# Patient Record
Sex: Male | Born: 1990 | Race: Black or African American | Hispanic: No | Smoking: Current every day smoker
Health system: Southern US, Community
[De-identification: ages and names within clinical notes are randomized; demographics above are authoritative.]

## PROBLEM LIST (undated history)

## (undated) DIAGNOSIS — E7800 Pure hypercholesterolemia, unspecified: Secondary | ICD-10-CM

---

## 2016-10-18 ENCOUNTER — Ambulatory Visit (HOSPITAL_COMMUNITY)
Admission: EM | Admit: 2016-10-18 | Discharge: 2016-10-18 | Disposition: A | Discharge status: Home / Self Care | Payer: PRIVATE HEALTH INSURANCE | Attending: Family Medicine | Admitting: Family Medicine

## 2016-10-18 ENCOUNTER — Ambulatory Visit (INDEPENDENT_AMBULATORY_CARE_PROVIDER_SITE_OTHER): Payer: PRIVATE HEALTH INSURANCE

## 2016-10-18 ENCOUNTER — Encounter (HOSPITAL_COMMUNITY): Payer: Self-pay | Admitting: Emergency Medicine

## 2016-10-18 DIAGNOSIS — M25562 Pain in left knee: Secondary | ICD-10-CM

## 2016-10-18 DIAGNOSIS — S838X2A Sprain of other specified parts of left knee, initial encounter: Secondary | ICD-10-CM | POA: Diagnosis not present

## 2016-10-18 MED ORDER — TRAMADOL HCL 50 MG PO TABS: ORAL_TABLET | ORAL | 0 refills | Status: DC

## 2016-10-18 MED ORDER — NAPROXEN 500 MG PO TABS: 500.0000 mg | ORAL_TABLET | Freq: Two times a day (BID) | ORAL | 0 refills | Status: DC

## 2016-10-18 NOTE — ED Triage Notes (Signed)
Here for left knee pain onset today  Reports he was walking and stepped off a curb and twisted ankle and fell down to the ground inj left knee/cap  Pain increases w/activity  Had aspirin today for pain  Brought back on wheelchair... A&O x4... NAD

## 2016-10-18 NOTE — Discharge Instructions (Signed)
Hopefully this is a sprain following your injury. However keep supported while ambulating. Elevate, ice and take NSAID as directed for the next 4-5 days. Use tramadol as needed for more severe pain for mostly at night. Call Dr. Eliberto IvoryBlackman's office on Monday for further evaluation. Feel better.

## 2016-10-18 NOTE — ED Provider Notes (Signed)
CSN: [id number]     Arrival date & time 10/18/16  1811 History   First MD Initiated Contact with Patient 10/18/16 1832     Chief Complaint  Patient presents with  . Knee Pain   (Consider location/radiation/quality/duration/timing/severity/associated sxs/prior Treatment) Harriett Sineerrance is a 25 yo who presents with left knee pain. He stepped off a curb today and twisted the left knee, thinking that it "I dislocated my patella". He reports that this happened in younger years and at that time had to have a knee aspiration. He reports this feels the same way. After the fall he was able to bear weight and ambulate, but when he got home the knee started to swell and has become very painful. He has pain with flexion and full extension.        History reviewed. No pertinent past medical history. History reviewed. No pertinent surgical history. History reviewed. No pertinent family history. Social History  Substance Use Topics  . Smoking status: Current Every Day Smoker    Types: E-cigarettes  . Smokeless tobacco: Never Used  . Alcohol use Yes    Review of Systems  All other systems reviewed and are negative.   Allergies  Shrimp [shellfish allergy]  Home Medications   Prior to Admission medications   Medication Sig Start Date End Date Taking? Authorizing Provider  naproxen (NAPROSYN) 500 MG tablet Take 1 tablet (500 mg total) by mouth 2 (two) times daily with a meal. 10/18/16   Riki SheerMichelle G Jaelee Laughter, PA-C  traMADol (ULTRAM) 50 MG tablet 1 every 6 hours as needed for severe pain 10/18/16   Riki SheerMichelle G Janella Rogala, PA-C   Meds Ordered and Administered this Visit  Medications - No data to display  BP 120/75 (BP Location: Left Arm)   Temp 99.3 F (37.4 C) (Oral)   Resp 20   SpO2 98%  No data found.   Physical Exam  Constitutional: He is oriented to person, place, and time. He appears well-developed and well-nourished. No distress.  Musculoskeletal:  Left knee with warmth and small effusion. Pain  along the lateral patella, but no obvious deformities. Pain with attempt at extension, currently examined in flexion  Neurological: He is alert and oriented to person, place, and time.  Skin: Skin is warm and dry. He is not diaphoretic.  Nursing note and vitals reviewed.   Urgent Care Course   Clinical Course     Procedures (including critical care time)  Labs Review Labs Reviewed - No data to display  Imaging Review Dg Knee Complete 4 Views Left  Result Date: 10/18/2016 CLINICAL DATA:  Stepped off curb wrong, pain in the knee EXAM: LEFT KNEE - COMPLETE 4+ VIEW COMPARISON:  None. FINDINGS: No fracture or dislocation is evident. Small to moderate suprapatellar joint effusion. Joint space compartments appear maintained. IMPRESSION: No acute fracture.  Small to moderate suprapatellar joint effusion Electronically Signed   By: Jasmine PangKim  Fujinaga M.D.   On: 10/18/2016 19:31     Visual Acuity Review  Right Eye Distance:   Left Eye Distance:   Bilateral Distance:    Right Eye Near:   Left Eye Near:    Bilateral Near:         MDM   1. Acute pain of left knee   2. Sprain of other ligament of left knee, initial encounter    Xrays reviewed. Patient with moderate pain and small joint effusion. Place in immobilizer, crutches with weight bear as tolerated. Ice, elevate and f/u with Orthopedics. Treat acutely  with NSAIDs and as needed Tramadol for more severe pain.     Riki SheerMichelle G Taber Sweetser, PA-C 10/18/16 [phone]

## 2016-11-04 ENCOUNTER — Ambulatory Visit (INDEPENDENT_AMBULATORY_CARE_PROVIDER_SITE_OTHER): Payer: Self-pay | Admitting: Orthopaedic Surgery

## 2016-12-27 ENCOUNTER — Ambulatory Visit (HOSPITAL_COMMUNITY)
Admission: EM | Admit: 2016-12-27 | Discharge: 2016-12-27 | Disposition: A | Discharge status: Home / Self Care | Payer: PRIVATE HEALTH INSURANCE | Attending: Family Medicine | Admitting: Family Medicine

## 2016-12-27 ENCOUNTER — Encounter (HOSPITAL_COMMUNITY): Payer: Self-pay | Admitting: Emergency Medicine

## 2016-12-27 DIAGNOSIS — G44209 Tension-type headache, unspecified, not intractable: Secondary | ICD-10-CM

## 2016-12-27 DIAGNOSIS — G43809 Other migraine, not intractable, without status migrainosus: Secondary | ICD-10-CM

## 2016-12-27 DIAGNOSIS — R42 Dizziness and giddiness: Secondary | ICD-10-CM | POA: Diagnosis not present

## 2016-12-27 MED ORDER — MECLIZINE HCL 25 MG PO TABS: 25.0000 mg | ORAL_TABLET | Freq: Three times a day (TID) | ORAL | 0 refills | Status: DC | PRN

## 2016-12-27 MED ORDER — CYCLOBENZAPRINE HCL 10 MG PO TABS: 10.0000 mg | ORAL_TABLET | Freq: Every day | ORAL | 0 refills | Status: DC

## 2016-12-27 MED ORDER — METHYLPREDNISOLONE 4 MG PO TBPK: ORAL_TABLET | ORAL | 0 refills | Status: DC

## 2016-12-27 NOTE — ED Triage Notes (Signed)
PT reports intermittent headaches and lightheadedness that started a  Month ago. PT has increased stress at work. PT believes stress makes episodes worse and more frequent.

## 2016-12-27 NOTE — ED Provider Notes (Signed)
CSN: [id number]     Arrival date & time 12/27/16  1513 History   First MD Initiated Contact with Patient 12/27/16 1558     Chief Complaint  Patient presents with  . Headache  . Dizziness   (Consider location/radiation/quality/duration/timing/severity/associated sxs/prior Treatment) Patient c/o pulsatile headache that starts with pressure in frontal area and then develops nausea and migraine features.  C/o phonophotophobia sx's.  Patient c/o dizziness.   The history is provided by the patient.  Headache  Pain location:  Generalized Quality:  Dull Severity currently:  5/10 Severity at highest:  8/10 Onset quality:  Sudden Duration:  1 day Timing:  Constant Progression:  Waxing and waning Chronicity:  New Similar to prior headaches: yes   Context: bright light and loud noise   Associated symptoms: dizziness   Dizziness  Associated symptoms: headaches     History reviewed. No pertinent past medical history. History reviewed. No pertinent surgical history. No family history on file. Social History  Substance Use Topics  . Smoking status: Current Every Day Smoker    Types: E-cigarettes  . Smokeless tobacco: Never Used  . Alcohol use Yes     Comment: 1-2 per week    Review of Systems  Constitutional: Negative.   HENT: Negative.   Eyes: Negative.   Respiratory: Negative.   Cardiovascular: Negative.   Gastrointestinal: Negative.   Endocrine: Negative.   Genitourinary: Negative.   Musculoskeletal: Negative.   Neurological: Positive for dizziness and headaches.  Hematological: Negative.   Psychiatric/Behavioral: Negative.     Allergies  Shrimp [shellfish allergy]  Home Medications   Prior to Admission medications   Medication Sig Start Date End Date Taking? Authorizing Provider  cyclobenzaprine (FLEXERIL) 10 MG tablet Take 1 tablet (10 mg total) by mouth at bedtime. 12/27/16   Deatra CanterWilliam J Jailen Lung, FNP  meclizine (ANTIVERT) 25 MG tablet Take 1 tablet (25 mg total) by  mouth 3 (three) times daily as needed for dizziness. 12/27/16   Deatra CanterWilliam J Ndia Sampath, FNP  methylPREDNISolone (MEDROL DOSEPAK) 4 MG TBPK tablet Take 6-5-4-3-2-1 po qd 12/27/16   Deatra CanterWilliam J Jameia Makris, FNP  naproxen (NAPROSYN) 500 MG tablet Take 1 tablet (500 mg total) by mouth 2 (two) times daily with a meal. 10/18/16   Riki SheerMichelle G Young, PA-C  traMADol (ULTRAM) 50 MG tablet 1 every 6 hours as needed for severe pain 10/18/16   Riki SheerMichelle G Young, PA-C   Meds Ordered and Administered this Visit  Medications - No data to display  BP 157/99   Pulse 89   Temp 98.5 F (36.9 C) (Oral)   Resp 16   Ht 5\' 7"  (1.702 m)   Wt 160 lb (72.6 kg)   SpO2 100%   BMI 25.06 kg/m  No data found.   Physical Exam  Constitutional: He is oriented to person, place, and time. He appears well-developed and well-nourished.  HENT:  Head: Normocephalic and atraumatic.  Right Ear: External ear normal.  Left Ear: External ear normal.  Mouth/Throat: Oropharynx is clear and moist.  Eyes: Conjunctivae and EOM are normal. Pupils are equal, round, and reactive to light.  Neck: Normal range of motion. Neck supple.  Cardiovascular: Normal rate, regular rhythm and normal heart sounds.   Pulmonary/Chest: Effort normal and breath sounds normal.  Neurological: He is alert and oriented to person, place, and time.  Nursing note and vitals reviewed.   Urgent Care Course     Procedures (including critical care time)  Labs Review Labs Reviewed - No data to  display  Imaging Review No results found.   Visual Acuity Review  Right Eye Distance:   Left Eye Distance:   Bilateral Distance:    Right Eye Near:   Left Eye Near:    Bilateral Near:         MDM   1. Vertigo   2. Tension-type headache, not intractable, unspecified chronicity pattern   3. Other migraine without status migrainosus, not intractable    Antivert 25mg  one po tid prn #21 Medrol dose pack as directed 4mg  #21 Cyclobenzaprine 10mg  po qhs #20       Deatra Canter, FNP 12/27/16 1708

## 2017-05-25 ENCOUNTER — Encounter (HOSPITAL_COMMUNITY): Payer: Self-pay | Admitting: Emergency Medicine

## 2017-05-25 ENCOUNTER — Ambulatory Visit (HOSPITAL_COMMUNITY)
Admission: EM | Admit: 2017-05-25 | Discharge: 2017-05-25 | Disposition: A | Discharge status: Home / Self Care | Payer: PRIVATE HEALTH INSURANCE | Attending: Physician Assistant | Admitting: Physician Assistant

## 2017-05-25 DIAGNOSIS — J029 Acute pharyngitis, unspecified: Secondary | ICD-10-CM | POA: Diagnosis not present

## 2017-05-25 MED ORDER — AZITHROMYCIN 250 MG PO TABS: ORAL_TABLET | ORAL | 0 refills | Status: AC

## 2017-05-25 NOTE — ED Triage Notes (Signed)
Patient reports symptoms for a month.  Chills, sore throat, throat irritation.  Patient reports sinus drainage and throat congestion

## 2017-05-25 NOTE — ED Provider Notes (Signed)
CSN: [id number]     Arrival date & time 05/25/17  1204 History   None    Chief Complaint  Patient presents with  . URI   (Consider location/radiation/quality/duration/timing/severity/associated sxs/prior Treatment) HPI  Sore throat starting one month ago.  Associates nasal congestion and mild cough.  States that he is getting worse.  Air conditioning makes the pain worse. Denies fever, chills nausea. Has tried several OTC meds but this does not last long lasting relief. Has tried xyzal, claritin.   History reviewed. No pertinent past medical history. History reviewed. No pertinent surgical history. No family history on file. Social History  Substance Use Topics  . Smoking status: Current Every Day Smoker    Types: E-cigarettes  . Smokeless tobacco: Never Used  . Alcohol use Yes     Comment: 1-2 per week    Review of Systems  Constitutional: Negative for fever and unexpected weight change.  HENT: Positive for congestion, sinus pressure and sore throat. Negative for trouble swallowing.   Neurological: Negative for dizziness.    Allergies  Shrimp [shellfish allergy]  Home Medications   Prior to Admission medications   Medication Sig Start Date End Date Taking? Authorizing Provider  azithromycin (ZITHROMAX) 250 MG tablet Take 2 tabs PO x 1 dose, then 1 tab PO QD x 4 days 05/25/17 05/30/17  Ofilia Neaslark, Sayge Salvato L, PA-C   Meds Ordered and Administered this Visit  Medications - No data to display  BP 132/80 (BP Location: Right Arm)   Pulse (!) 109   Temp 99.7 F (37.6 C) (Oral)   Resp 20   SpO2 100%  No data found.  Physical Exam  Constitutional: He is oriented to person, place, and time. He appears well-developed and well-nourished. No distress.  HENT:  Head: Normocephalic.  Nose: Nose normal.  Mouth/Throat: Oropharynx is clear and moist. No oropharyngeal exudate.  Eyes: Pupils are equal, round, and reactive to light.  Neck: Normal range of motion.  Cardiovascular: Normal  rate, regular rhythm, normal heart sounds and intact distal pulses.   Neurological: He is alert and oriented to person, place, and time.  Skin: Skin is warm and dry. No rash noted. He is not diaphoretic. No erythema. No pallor.    Urgent Care Course     Procedures (including critical care time)  Labs Review Labs Reviewed - No data to display  Imaging Review No results found.   Visual Acuity Review  Right Eye Distance:   Left Eye Distance:   Bilateral Distance:    Right Eye Near:   Left Eye Near:    Bilateral Near:         MDM   1. Sore throat    Advised zpack given mild fever, chills, and duration of symptoms.  Advised that he also continue on claritin.     Ofilia NeasClark, Ryler Laskowski L, PA-C 05/25/17 1252

## 2017-05-25 NOTE — Discharge Instructions (Signed)
Complete abx.  If your symptoms are still present in ten days continue claritin and come back.

## 2017-10-23 DIAGNOSIS — J301 Allergic rhinitis due to pollen: Secondary | ICD-10-CM | POA: Diagnosis not present

## 2017-10-23 DIAGNOSIS — Z0000 Encounter for general adult medical examination without abnormal findings: Secondary | ICD-10-CM | POA: Diagnosis not present

## 2017-10-25 DIAGNOSIS — S83103A Unspecified subluxation of unspecified knee, initial encounter: Secondary | ICD-10-CM | POA: Diagnosis not present

## 2017-11-27 DIAGNOSIS — Z0000 Encounter for general adult medical examination without abnormal findings: Secondary | ICD-10-CM | POA: Diagnosis not present

## 2017-11-27 DIAGNOSIS — Z7289 Other problems related to lifestyle: Secondary | ICD-10-CM | POA: Diagnosis not present

## 2017-11-27 DIAGNOSIS — Z113 Encounter for screening for infections with a predominantly sexual mode of transmission: Secondary | ICD-10-CM | POA: Diagnosis not present

## 2017-12-04 DIAGNOSIS — Z0000 Encounter for general adult medical examination without abnormal findings: Secondary | ICD-10-CM | POA: Diagnosis not present

## 2018-08-26 DIAGNOSIS — J301 Allergic rhinitis due to pollen: Secondary | ICD-10-CM | POA: Diagnosis not present

## 2018-08-26 DIAGNOSIS — J45909 Unspecified asthma, uncomplicated: Secondary | ICD-10-CM | POA: Diagnosis not present

## 2019-05-28 DIAGNOSIS — Z20828 Contact with and (suspected) exposure to other viral communicable diseases: Secondary | ICD-10-CM | POA: Diagnosis not present

## 2019-06-10 DIAGNOSIS — E785 Hyperlipidemia, unspecified: Secondary | ICD-10-CM | POA: Diagnosis not present

## 2019-06-10 DIAGNOSIS — I10 Essential (primary) hypertension: Secondary | ICD-10-CM | POA: Diagnosis not present

## 2019-06-10 DIAGNOSIS — E039 Hypothyroidism, unspecified: Secondary | ICD-10-CM | POA: Diagnosis not present

## 2019-06-17 DIAGNOSIS — Z0000 Encounter for general adult medical examination without abnormal findings: Secondary | ICD-10-CM | POA: Diagnosis not present

## 2019-06-17 DIAGNOSIS — E785 Hyperlipidemia, unspecified: Secondary | ICD-10-CM | POA: Diagnosis not present

## 2019-06-18 DIAGNOSIS — N529 Male erectile dysfunction, unspecified: Secondary | ICD-10-CM | POA: Diagnosis not present

## 2019-06-18 DIAGNOSIS — R37 Sexual dysfunction, unspecified: Secondary | ICD-10-CM | POA: Diagnosis not present

## 2019-08-13 DIAGNOSIS — Z20828 Contact with and (suspected) exposure to other viral communicable diseases: Secondary | ICD-10-CM | POA: Diagnosis not present

## 2019-11-05 DIAGNOSIS — R05 Cough: Secondary | ICD-10-CM | POA: Diagnosis not present

## 2019-11-05 DIAGNOSIS — Z20828 Contact with and (suspected) exposure to other viral communicable diseases: Secondary | ICD-10-CM | POA: Diagnosis not present

## 2019-12-21 DIAGNOSIS — Z03818 Encounter for observation for suspected exposure to other biological agents ruled out: Secondary | ICD-10-CM | POA: Diagnosis not present

## 2019-12-21 DIAGNOSIS — Z20828 Contact with and (suspected) exposure to other viral communicable diseases: Secondary | ICD-10-CM | POA: Diagnosis not present

## 2020-10-20 ENCOUNTER — Emergency Department (HOSPITAL_BASED_OUTPATIENT_CLINIC_OR_DEPARTMENT_OTHER)
Admission: EM | Admit: 2020-10-20 | Discharge: 2020-10-20 | Disposition: A | Discharge status: Home / Self Care | Payer: 59 | Attending: Emergency Medicine | Admitting: Emergency Medicine

## 2020-10-20 ENCOUNTER — Other Ambulatory Visit: Payer: Self-pay

## 2020-10-20 ENCOUNTER — Emergency Department (HOSPITAL_BASED_OUTPATIENT_CLINIC_OR_DEPARTMENT_OTHER): Payer: 59

## 2020-10-20 ENCOUNTER — Encounter (HOSPITAL_BASED_OUTPATIENT_CLINIC_OR_DEPARTMENT_OTHER): Payer: Self-pay

## 2020-10-20 DIAGNOSIS — M25552 Pain in left hip: Secondary | ICD-10-CM | POA: Diagnosis not present

## 2020-10-20 DIAGNOSIS — F17290 Nicotine dependence, other tobacco product, uncomplicated: Secondary | ICD-10-CM | POA: Diagnosis not present

## 2020-10-20 DIAGNOSIS — M79651 Pain in right thigh: Secondary | ICD-10-CM | POA: Diagnosis not present

## 2020-10-20 DIAGNOSIS — M25551 Pain in right hip: Secondary | ICD-10-CM | POA: Diagnosis not present

## 2020-10-20 DIAGNOSIS — M79652 Pain in left thigh: Secondary | ICD-10-CM | POA: Diagnosis not present

## 2020-10-20 DIAGNOSIS — M545 Low back pain, unspecified: Secondary | ICD-10-CM | POA: Insufficient documentation

## 2020-10-20 DIAGNOSIS — M79604 Pain in right leg: Secondary | ICD-10-CM

## 2020-10-20 HISTORY — DX: Pure hypercholesterolemia, unspecified: E78.00

## 2020-10-20 LAB — BASIC METABOLIC PANEL
Anion gap: 9 (ref 5–15)
BUN: 11 mg/dL (ref 6–20)
CO2: 27 mmol/L (ref 22–32)
Calcium: 9.2 mg/dL (ref 8.9–10.3)
Chloride: 99 mmol/L (ref 98–111)
Creatinine, Ser: 0.87 mg/dL (ref 0.61–1.24)
GFR, Estimated: >60 mL/min (ref >60)
Glucose, Bld: 96 mg/dL (ref 70–99)
Potassium: 3.5 mmol/L (ref 3.5–5.1)
Sodium: 135 mmol/L (ref 135–145)

## 2020-10-20 LAB — CBC WITH DIFFERENTIAL/PLATELET
Abs Immature Granulocytes: 0.01 10*3/uL (ref 0.00–0.07)
Basophils Absolute: 0 10*3/uL (ref 0.0–0.1)
Basophils Relative: 0 %
Eosinophils Absolute: 0.1 10*3/uL (ref 0.0–0.5)
Eosinophils Relative: 2 %
HCT: 42.6 % (ref 39.0–52.0)
Hemoglobin: 14.4 g/dL (ref 13.0–17.0)
Immature Granulocytes: 0 %
Lymphocytes Relative: 26 %
Lymphs Abs: 1.6 10*3/uL (ref 0.7–4.0)
MCH: 33.2 pg (ref 26.0–34.0)
MCHC: 33.8 g/dL (ref 30.0–36.0)
MCV: 98.2 fL (ref 80.0–100.0)
Monocytes Absolute: 0.7 10*3/uL (ref 0.1–1.0)
Monocytes Relative: 11 %
Neutro Abs: 3.8 10*3/uL (ref 1.7–7.7)
Neutrophils Relative %: 61 %
Platelets: 309 10*3/uL (ref 150–400)
RBC: 4.34 MIL/uL (ref 4.22–5.81)
RDW: 11.9 % (ref 11.5–15.5)
WBC: 6.2 10*3/uL (ref 4.0–10.5)
nRBC: 0 % (ref 0.0–0.2)

## 2020-10-20 LAB — CK: Total CK: 159 U/L (ref 49–397)

## 2020-10-20 LAB — URINALYSIS, ROUTINE W REFLEX MICROSCOPIC
Glucose, UA: NEGATIVE mg/dL
Hgb urine dipstick: NEGATIVE
Ketones, ur: NEGATIVE mg/dL
Leukocytes,Ua: NEGATIVE
Nitrite: NEGATIVE
Protein, ur: NEGATIVE mg/dL
Specific Gravity, Urine: >1.03 — ABNORMAL HIGH (ref 1.005–1.030)
pH: 6 (ref 5.0–8.0)

## 2020-10-20 MED ORDER — SODIUM CHLORIDE 0.9 % IV BOLUS
1000.0000 mL | Freq: Once | INTRAVENOUS | Status: AC
  Administered 2020-10-20: 1000 mL via INTRAVENOUS

## 2020-10-20 MED ORDER — DICLOFENAC SODIUM 1 % EX GEL: 2.0000 g | Freq: Four times a day (QID) | CUTANEOUS | 0 refills | Status: AC | PRN

## 2020-10-20 NOTE — Discharge Instructions (Signed)
You were seen in the emergency department today with pain in the legs/thighs and lower back.  Your lab work here is reassuring.  I would like for you to stop taking the Lipitor and discussed with your primary care doctor other medications that can treat your cholesterol issues.  This can be a side effect of statins and may help improve your symptoms.  You may take Tylenol as needed for pain and I have also called in a prescription for some topical pain medication.  Return to the emergency department any new or suddenly worsening symptoms.

## 2020-10-20 NOTE — ED Triage Notes (Signed)
Pt c/o bilat leg pain started 11/12-denies injury-pain worse in the am and when he changes positions-NAD-steady gait

## 2020-10-20 NOTE — ED Provider Notes (Signed)
Emergency Department Provider Note   I have reviewed the triage vital signs and the nursing notes.   HISTORY  Chief Complaint Leg Pain   HPI Douglas Harrington is a 29 y.o. male presents to the ED with pain in the bilateral hip/thigh area. No significant lower back pain but some mild discomfort. No fever/chills. No radiation down the legs or weakness/numbness. No bowel bladder symptoms. Patient went to UC two days prior and had a UA performed which showed some findings concerning for myositis and was referred to the ED for evaluation of possible rhabdomyolysis. He notes that several months ago he re-started his statin taking what was left of an old Rx. Denies muscle cramping on this med prior. No vomiting. Patient notes he is staying well hydrated. Pain is moderate to severe and worse with movement. No injury.    Past Medical History:  Diagnosis Date  . High cholesterol     There are no problems to display for this patient.   History reviewed. No pertinent surgical history.  Allergies Fish allergy and Shrimp [shellfish allergy]  No family history on file.  Social History Social History   Tobacco Use  . Smoking status: Current Every Day Smoker    Types: E-cigarettes  . Smokeless tobacco: Never Used  Vaping Use  . Vaping Use: Every day  Substance Use Topics  . Alcohol use: Yes    Comment: daily  . Drug use: Not on file    Comment: "delta 8"    Review of Systems  Constitutional: No fever/chills Eyes: No visual changes. ENT: No sore throat. Cardiovascular: Denies chest pain. Respiratory: Denies shortness of breath. Gastrointestinal: No abdominal pain.  No nausea, no vomiting.  No diarrhea.  No constipation. Genitourinary: Negative for dysuria. Musculoskeletal: Positive back, hip, and thigh pain.  Skin: Negative for rash. Neurological: Negative for headaches, focal weakness or numbness.  10-point ROS otherwise  negative.  ____________________________________________   PHYSICAL EXAM:  VITAL SIGNS: ED Triage Vitals  Enc Vitals Group     BP 10/20/20 1206 (!) 136/108     Pulse Rate 10/20/20 1206 90     Resp 10/20/20 1206 18     Temp 10/20/20 1206 98.4 F (36.9 C)     Temp Source 10/20/20 1206 Oral     SpO2 10/20/20 1206 98 %     Weight 10/20/20 1200 158 lb (71.7 kg)     Height 10/20/20 1200 5\' 7"  (1.702 m)   Constitutional: Alert and oriented. Well appearing and in no acute distress. Eyes: Conjunctivae are normal.  Head: Atraumatic. Nose: No congestion/rhinnorhea. Mouth/Throat: Mucous membranes are moist.  Neck: No stridor.   Cardiovascular: Normal rate, regular rhythm. Good peripheral circulation. Grossly normal heart sounds.   Respiratory: Normal respiratory effort.  No retractions. Lungs CTAB. Gastrointestinal: No distention.  Musculoskeletal: No lower extremity tenderness nor edema. No gross deformities of extremities. Normal active and passive ROM.  Neurologic:  Normal speech and language. Skin:  Skin is warm, dry and intact. No rash noted.   ____________________________________________   LABS (all labs ordered are listed, but only abnormal results are displayed)  Labs Reviewed  URINALYSIS, ROUTINE W REFLEX MICROSCOPIC - Abnormal; Notable for the following components:      Result Value   APPearance HAZY (*)    Specific Gravity, Urine >1.030 (*)    Bilirubin Urine SMALL (*)    All other components within normal limits  BASIC METABOLIC PANEL  CBC WITH DIFFERENTIAL/PLATELET  CK   ____________________________________________  RADIOLOGY  DG Lumbar Spine 2-3 Views  Result Date: 10/20/2020 CLINICAL DATA:  Low back pain and bilateral hip pain for several weeks EXAM: LUMBAR SPINE - 2-3 VIEW COMPARISON:  None. FINDINGS: Frontal and lateral views of the lumbar spine are obtained. There are 5 non-rib-bearing lumbar type vertebral bodies in anatomic alignment. No acute displaced  fracture. Disc spaces are well preserved. Sacroiliac joints are normal. IMPRESSION: 1. Unremarkable lumbar spine. Electronically Signed   By: Sharlet Salina M.D.   On: 10/20/2020 17:10   DG Pelvis 1-2 Views  Result Date: 10/20/2020 CLINICAL DATA:  Low back pain, bilateral hip pain EXAM: PELVIS - 1-2 VIEW COMPARISON:  None. FINDINGS: Frontal view the pelvis demonstrates no fractures. Hips are well aligned. Joint spaces are well preserved. Soft tissues are normal. IMPRESSION: 1. Unremarkable pelvis. Electronically Signed   By: Sharlet Salina M.D.   On: 10/20/2020 17:09    ____________________________________________   PROCEDURES  Procedure(s) performed:   Procedures  None ____________________________________________   INITIAL IMPRESSION / ASSESSMENT AND PLAN / ED COURSE  Pertinent labs & imaging results that were available during my care of the patient were reviewed by me and considered in my medical decision making (see chart for details).   Patient with tightness and pain in the bilateral hips and thighs. Doubt fracture, septic joint, or acute spine emergency such as cauda equina after exam. Patient has re-started a STATIN which may be causing symptoms. IVF given. CK normal. No AKI. Normal electrolytes. Plan to STOP his statin and f/u with PCP for alternate cholesterol med.    ____________________________________________  FINAL CLINICAL IMPRESSION(S) / ED DIAGNOSES  Final diagnoses:  Bilateral leg pain     MEDICATIONS GIVEN DURING THIS VISIT:  Medications  sodium chloride 0.9 % bolus 1,000 mL ( Intravenous Stopped 10/20/20 1748)     NEW OUTPATIENT MEDICATIONS STARTED DURING THIS VISIT:  Discharge Medication List as of 10/20/2020  5:43 PM    START taking these medications   Details  diclofenac Sodium (VOLTAREN) 1 % GEL Apply 2 g topically 4 (four) times daily as needed., Starting Fri 10/20/2020, Normal        Note:  This document was prepared using Dragon voice  recognition software and may include unintentional dictation errors.  Alona Bene, MD, [facility] Emergency Medicine    Norinne Jeane, Arlyss Repress, MD 10/21/20 1055

## 2020-11-14 NOTE — Progress Notes (Addendum)
Patient ID: Douglas Harrington, male   DOB: 30-Oct-1991, 29 y.o.   MRN: [id number]   Virtual Visit via Telephone Note  I connected with Douglas Harrington on 11/16/20 at 10:10 AM EST by telephone and verified that I am speaking with the correct person using two identifiers.  Location: Patient: Douglas Harrington Provider: Georgian Co, PA-c   I discussed the limitations, risks, security and privacy concerns of performing an evaluation and management service by telephone and the availability of in person appointments. I also discussed with the patient that there may be a patient responsible charge related to this service. The patient expressed understanding and agreed to proceed.  PATIENT visit by telephone virtually in the context of Covid-19 pandemic. Patient location:  home My Location:  [facility] office Persons on the call: screened by Felecia Shelling, me and the patient    History of Present Illness: Patient seen in the ED 10/20/2020 for LBP and B hip pain thought to be caused by pravastatin.  It had never caused him problems in the past and the pain continued after he stopped it.  He has not had his cholesterol checked in a long time.  He said the voltaren gel was not helping.   HPI Douglas Harrington is a 29 y.o. male presents to the ED with pain in the bilateral hip/thigh area. No significant lower back pain but some mild discomfort. No fever/chills. No radiation down the legs or weakness/numbness. No bowel bladder symptoms. Patient went to UC two days prior and had a UA performed which showed some findings concerning for myositis and was referred to the ED for evaluation of possible rhabdomyolysis. He notes that several months ago he re-started his statin taking what was left of an old Rx. Denies muscle cramping on this med prior. No vomiting. Patient notes he is staying well hydrated. Pain is moderate to severe and worse with movement. No injury.  RADIOLOGY  DG Lumbar Spine 2-3 Views  Result Date:  10/20/2020 CLINICAL DATA:  Low back pain and bilateral hip pain for several weeks EXAM: LUMBAR SPINE - 2-3 VIEW COMPARISON:  None. FINDINGS: Frontal and lateral views of the lumbar spine are obtained. There are 5 non-rib-bearing lumbar type vertebral bodies in anatomic alignment. No acute displaced fracture. Disc spaces are well preserved. Sacroiliac joints are normal. IMPRESSION: 1. Unremarkable lumbar spine. Electronically Signed   By: Sharlet Salina M.D.   On: 10/20/2020 17:10   DG Pelvis 1-2 Views  Result Date: 10/20/2020 CLINICAL DATA:  Low back pain, bilateral hip pain EXAM: PELVIS - 1-2 VIEW COMPARISON:  None. FINDINGS: Frontal view the pelvis demonstrates no fractures. Hips are well aligned. Joint spaces are well preserved. Soft tissues are normal. IMPRESSION: 1. Unremarkable pelvis. Electronically Signed   By: Sharlet Salina M.D.   On: 10/20/2020 17:09   INITIAL IMPRESSION / ASSESSMENT AND PLAN / ED COURSE  Pertinent labs & imaging results that were available during my care of the patient were reviewed by me and considered in my medical decision making (see chart for details).  Patient with tightness and pain in the bilateral hips and thighs. Doubt fracture, septic joint, or acute spine emergency such as cauda equina after exam. Patient has re-started a STATIN which may be causing symptoms. IVF given. CK normal. No AKI. Normal electrolytes. Plan to STOP his statin and f/u with PCP for alternate cholesterol med.     Observations/Objective:  NAD.  A&Ox3   Assessment and Plan: 1. Acute low back pain, unspecified back  pain laterality, unspecified whether sciatica present - diclofenac (VOLTAREN) 75 MG EC tablet; Take 1 tablet (75 mg total) by mouth 2 (two) times daily. Prn pain  Dispense: 60 tablet; Refill: 0 - methocarbamol (ROBAXIN) 500 MG tablet; Take 2 tablets (1,000 mg total) by mouth every 8 (eight) hours as needed for muscle spasms.  Dispense: 90 tablet; Refill: 0 - Ambulatory  referral to Orthopedic Surgery  2. Bilateral hip pain - diclofenac (VOLTAREN) 75 MG EC tablet; Take 1 tablet (75 mg total) by mouth 2 (two) times daily. Prn pain  Dispense: 60 tablet; Refill: 0 - methocarbamol (ROBAXIN) 500 MG tablet; Take 2 tablets (1,000 mg total) by mouth every 8 (eight) hours as needed for muscle spasms.  Dispense: 90 tablet; Refill: 0 - Ambulatory referral to Orthopedic Surgery  3. Encounter for examination following treatment at hospital  4. High cholesterol - Lipid panel; Future - Comprehensive metabolic panel; Future -will resume pravastatin and send new Rx for it if appropriate since he tolerated it fine previously and pain didn't stop with discontinuing   Follow Up Instructions: Assign PCP 6-8 weeks   I discussed the assessment and treatment plan with the patient. The patient was provided an opportunity to ask questions and all were answered. The patient agreed with the plan and demonstrated an understanding of the instructions.   The patient was advised to call back or seek an in-person evaluation if the symptoms worsen or if the condition fails to improve as anticipated.  I provided 17 minutes of non-face-to-face time during this encounter.   Georgian Co, PA-C

## 2020-11-16 ENCOUNTER — Other Ambulatory Visit: Payer: Self-pay

## 2020-11-16 ENCOUNTER — Ambulatory Visit: Payer: 59 | Attending: Physician Assistant | Admitting: Physician Assistant

## 2020-11-16 DIAGNOSIS — E7800 Pure hypercholesterolemia, unspecified: Secondary | ICD-10-CM | POA: Diagnosis not present

## 2020-11-16 DIAGNOSIS — Z09 Encounter for follow-up examination after completed treatment for conditions other than malignant neoplasm: Secondary | ICD-10-CM | POA: Diagnosis not present

## 2020-11-16 DIAGNOSIS — M25551 Pain in right hip: Secondary | ICD-10-CM | POA: Diagnosis not present

## 2020-11-16 DIAGNOSIS — M25552 Pain in left hip: Secondary | ICD-10-CM

## 2020-11-16 DIAGNOSIS — M545 Low back pain, unspecified: Secondary | ICD-10-CM | POA: Diagnosis not present

## 2020-11-16 MED ORDER — METHOCARBAMOL 500 MG PO TABS: 1000.0000 mg | ORAL_TABLET | Freq: Three times a day (TID) | ORAL | 0 refills | Status: DC | PRN

## 2020-11-16 MED ORDER — DICLOFENAC SODIUM 75 MG PO TBEC: 75.0000 mg | DELAYED_RELEASE_TABLET | Freq: Two times a day (BID) | ORAL | 0 refills | Status: DC

## 2020-11-30 ENCOUNTER — Ambulatory Visit: Payer: 59 | Admitting: Orthopaedic Surgery

## 2020-11-30 ENCOUNTER — Ambulatory Visit (INDEPENDENT_AMBULATORY_CARE_PROVIDER_SITE_OTHER): Payer: Managed Care, Other (non HMO) | Admitting: Orthopaedic Surgery

## 2020-11-30 ENCOUNTER — Encounter: Payer: Self-pay | Admitting: Orthopaedic Surgery

## 2020-11-30 VITALS — Ht 67.0 in | Wt 150.0 lb

## 2020-11-30 DIAGNOSIS — M25551 Pain in right hip: Secondary | ICD-10-CM

## 2020-11-30 DIAGNOSIS — M25552 Pain in left hip: Secondary | ICD-10-CM
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
## 2020-12-01 ENCOUNTER — Ambulatory Visit (INDEPENDENT_AMBULATORY_CARE_PROVIDER_SITE_OTHER): Payer: Managed Care, Other (non HMO) | Admitting: Family Medicine

## 2020-12-01 ENCOUNTER — Ambulatory Visit: Payer: Self-pay

## 2020-12-01 ENCOUNTER — Other Ambulatory Visit: Payer: Self-pay

## 2020-12-01 DIAGNOSIS — M25551 Pain in right hip: Secondary | ICD-10-CM

## 2020-12-01 DIAGNOSIS — M25552 Pain in left hip: Secondary | ICD-10-CM | POA: Diagnosis not present

## 2020-12-01 NOTE — Progress Notes (Signed)
Subjective: Patient is here for ultrasound-guided intra-articular right hip injection.   Both hips have been bothering him since around November, but the right one is worse.  He has presumed labrum tears.  Objective: Pain with passive flexion and internal rotation.  Procedure: Ultrasound guided injection is preferred based studies that show increased duration, increased effect, greater accuracy, decreased procedural pain, increased response rate, and decreased cost with ultrasound guided versus blind injection.   Verbal informed consent obtained.  Time-out conducted.  Noted no overlying erythema, induration, or other signs of local infection. Ultrasound-guided right hip injection: After sterile prep with Betadine, injected 4 cc 0.25% bupivacaine without epinephrine and 6 mg betamethasone using a 22-gauge spinal needle, passing the needle through the iliofemoral ligament into the femoral head/neck junction.  Injectate was seen filling the joint capsule.  He had very good immediate relief.  We can do an injection on the left in the future if needed.

## 2020-12-08 ENCOUNTER — Encounter: Payer: Self-pay | Admitting: Family Medicine

## 2020-12-08 ENCOUNTER — Ambulatory Visit: Payer: Managed Care, Other (non HMO) | Admitting: Orthopaedic Surgery

## 2020-12-08 DIAGNOSIS — M25552 Pain in left hip: Secondary | ICD-10-CM

## 2020-12-08 DIAGNOSIS — M25551 Pain in right hip: Secondary | ICD-10-CM

## 2020-12-20 ENCOUNTER — Other Ambulatory Visit: Payer: Self-pay | Admitting: Physician Assistant

## 2020-12-20 DIAGNOSIS — M545 Low back pain, unspecified: Secondary | ICD-10-CM

## 2020-12-20 DIAGNOSIS — M25551 Pain in right hip: Secondary | ICD-10-CM

## 2020-12-21 MED ORDER — METHOCARBAMOL 500 MG PO TABS
1000.0000 mg | ORAL_TABLET | Freq: Three times a day (TID) | ORAL | 0 refills | Status: AC | PRN
Start: 2020-12-21 — End: ?

## 2020-12-21 MED ORDER — DICLOFENAC SODIUM 75 MG PO TBEC: 75.0000 mg | DELAYED_RELEASE_TABLET | Freq: Two times a day (BID) | ORAL | 0 refills | Status: AC

## 2020-12-28 ENCOUNTER — Encounter: Payer: Self-pay | Admitting: Orthopaedic Surgery

## 2020-12-28 ENCOUNTER — Telehealth: Payer: Self-pay | Admitting: Orthopaedic Surgery
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
## 2020-12-28 NOTE — Telephone Encounter (Signed)
Patient called requesting a call back. Pt was advised from Dr. Prince Rome to call Dr. Roda Shutters and get medical advice about leg.Marland Kitchen Please call patient at [phone].

## 2020-12-28 NOTE — Telephone Encounter (Signed)
Spoke to patient and he will get back in touch with me

## 2021-01-05 ENCOUNTER — Ambulatory Visit
Admission: RE | Admit: 2021-01-05 | Discharge: 2021-01-05 | Disposition: A | Discharge status: Home / Self Care | Payer: Managed Care, Other (non HMO) | Source: Ambulatory Visit | Attending: Family Medicine | Admitting: Family Medicine

## 2021-01-05 ENCOUNTER — Other Ambulatory Visit: Payer: Self-pay

## 2021-01-05 DIAGNOSIS — M25551 Pain in right hip: Secondary | ICD-10-CM

## 2021-01-05 MED ORDER — IOPAMIDOL (ISOVUE-M 200) INJECTION 41%
13.0000 mL | Freq: Once | INTRAMUSCULAR | Status: AC
  Administered 2021-01-05: 13 mL via INTRA_ARTICULAR

## 2021-01-08 ENCOUNTER — Telehealth: Payer: Self-pay | Admitting: Family Medicine

## 2021-01-08 NOTE — Telephone Encounter (Signed)
MRI shows avascular necrosis of both hips.

## 2021-01-08 NOTE — Progress Notes (Signed)
Thank you :)

## 2021-01-08 NOTE — Telephone Encounter (Signed)
I called, patient requested Wednesday appt as he is off of work. Appt scheduled.

## 2021-01-10 ENCOUNTER — Other Ambulatory Visit: Payer: Self-pay

## 2021-01-10 ENCOUNTER — Ambulatory Visit (INDEPENDENT_AMBULATORY_CARE_PROVIDER_SITE_OTHER): Payer: Managed Care, Other (non HMO) | Admitting: Orthopaedic Surgery

## 2021-01-10 ENCOUNTER — Encounter: Payer: Self-pay | Admitting: Orthopaedic Surgery

## 2021-01-10 DIAGNOSIS — M87052 Idiopathic aseptic necrosis of left femur: Secondary | ICD-10-CM

## 2021-01-10 DIAGNOSIS — M87051 Idiopathic aseptic necrosis of right femur: Secondary | ICD-10-CM | POA: Diagnosis not present

## 2021-01-10 NOTE — Addendum Note (Signed)
Addended by: Albertina Parr on: 01/10/2021 02:17 PM   Modules accepted: Orders

## 2021-01-10 NOTE — Progress Notes (Signed)
Office Visit Note   Patient: Douglas Harrington           Date of Birth: 03-23-1991           MRN: [id number] Visit Date: 01/10/2021              Requested by: No referring provider defined for this encounter. PCP: Patient, No Pcp Per   Assessment & Plan: Visit Diagnoses:  1. Avascular necrosis of bones of both hips (HCC)     Plan: MRI shows bilateral hip avascular necrosis with extensive marrow edema worse on the right side.  He is findings were reviewed with the patient in detail and treatment options discussed to include bisphosphonates, protective weightbearing, NSAIDs, core decompression, total hip replacement possible bilateral and their associated risks and benefits.  He will take time to think about his options and he also requested a second opinion to [facility].  Referral was made today.  He will get back in touch with me in the future.  Follow-Up Instructions: Return if symptoms worsen or fail to improve.   Orders:  No orders of the defined types were placed in this encounter.  No orders of the defined types were placed in this encounter.     Procedures: No procedures performed   Clinical Data: No additional findings.   Subjective: Chief Complaint  Patient presents with  . Left Hip - Pain  . Right Hip - Pain    Douglas Harrington returns today for MRI review of his hips.  He is actually feeling pretty good and does not have any severe pain like he was having originally.  He works a Office manager.  He is currently taking diclofenac and Robaxin.  He states that he had a span of time when he was younger in which he did have heavy alcohol intake.   Review of Systems  Constitutional: Negative.   All other systems reviewed and are negative.    Objective: Vital Signs: There were no vitals taken for this visit.  Physical Exam Vitals and nursing note reviewed.  Constitutional:      Appearance: He is well-developed and well-nourished.  Pulmonary:      Effort: Pulmonary effort is normal.  Abdominal:     Palpations: Abdomen is soft.  Skin:    General: Skin is warm.  Neurological:     Mental Status: He is alert and oriented to person, place, and time.  Psychiatric:        Mood and Affect: Mood and affect normal.        Behavior: Behavior normal.        Thought Content: Thought content normal.        Judgment: Judgment normal.     Ortho Exam Bilateral hip exams showed minimal discomfort.  Well-preserved range of motion. Specialty Comments:  No specialty comments available.  Imaging: No results found.   PMFS History: Patient Active Problem List   Diagnosis Date Noted  . Avascular necrosis of bones of both hips (HCC) 01/10/2021  . High cholesterol 11/16/2020   Past Medical History:  Diagnosis Date  . High cholesterol     History reviewed. No pertinent family history.  History reviewed. No pertinent surgical history. Social History   Occupational History  . Not on file  Tobacco Use  . Smoking status: Current Every Day Smoker    Types: E-cigarettes  . Smokeless tobacco: Never Used  Vaping Use  . Vaping Use: Every day  Substance and Sexual Activity  .  Alcohol use: Yes    Comment: daily  . Drug use: Not on file    Comment: "delta 8"  . Sexual activity: Not on file

## 2021-01-12 ENCOUNTER — Encounter: Payer: Self-pay | Admitting: Orthopaedic Surgery

## 2021-01-22 ENCOUNTER — Telehealth: Payer: Self-pay

## 2021-02-02 ENCOUNTER — Encounter: Payer: Self-pay | Admitting: Orthopaedic Surgery

## 2021-06-28 IMAGING — DX DG LUMBAR SPINE 2-3V
3 series · 3 of 3 positions shown · non-contrast
Comparison: None.

CLINICAL DATA: Low back pain and bilateral hip pain for several
weeks

EXAM:
LUMBAR SPINE - 2-3 VIEW

[l-spine ap]
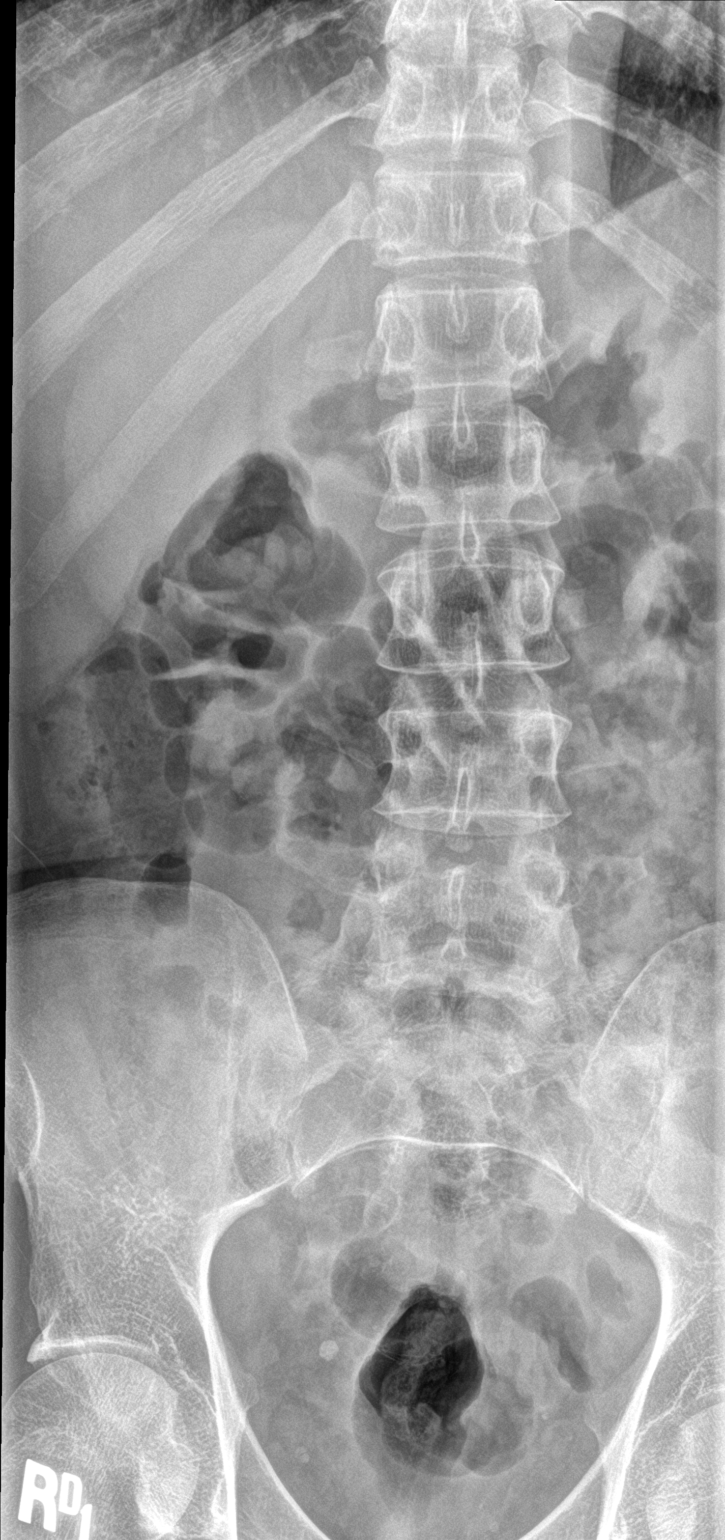

[l-spine lat]
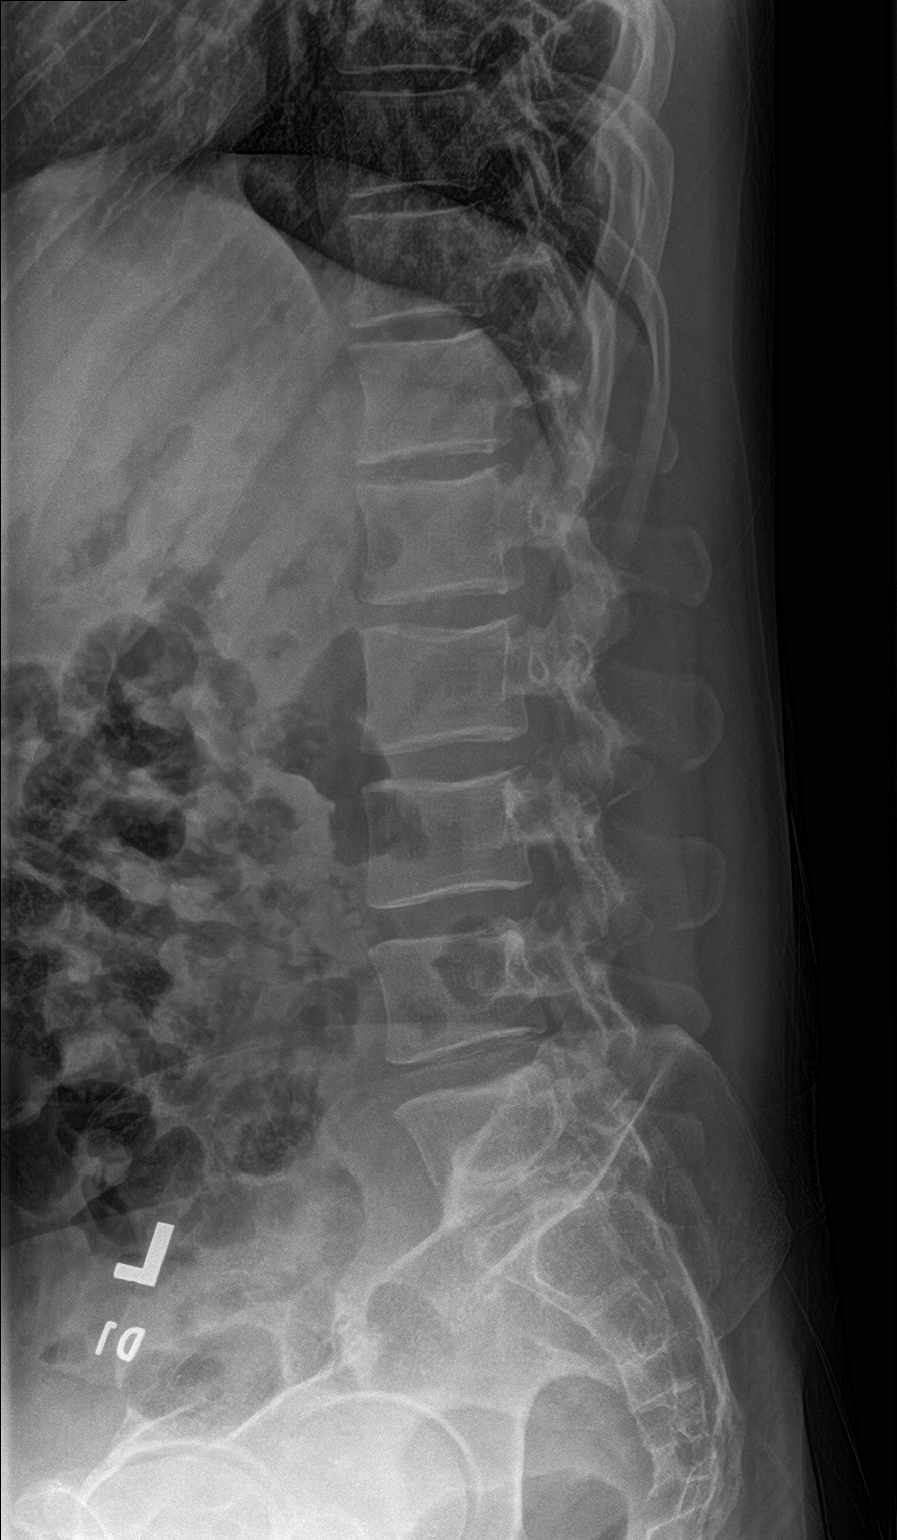

[l-spine spot]
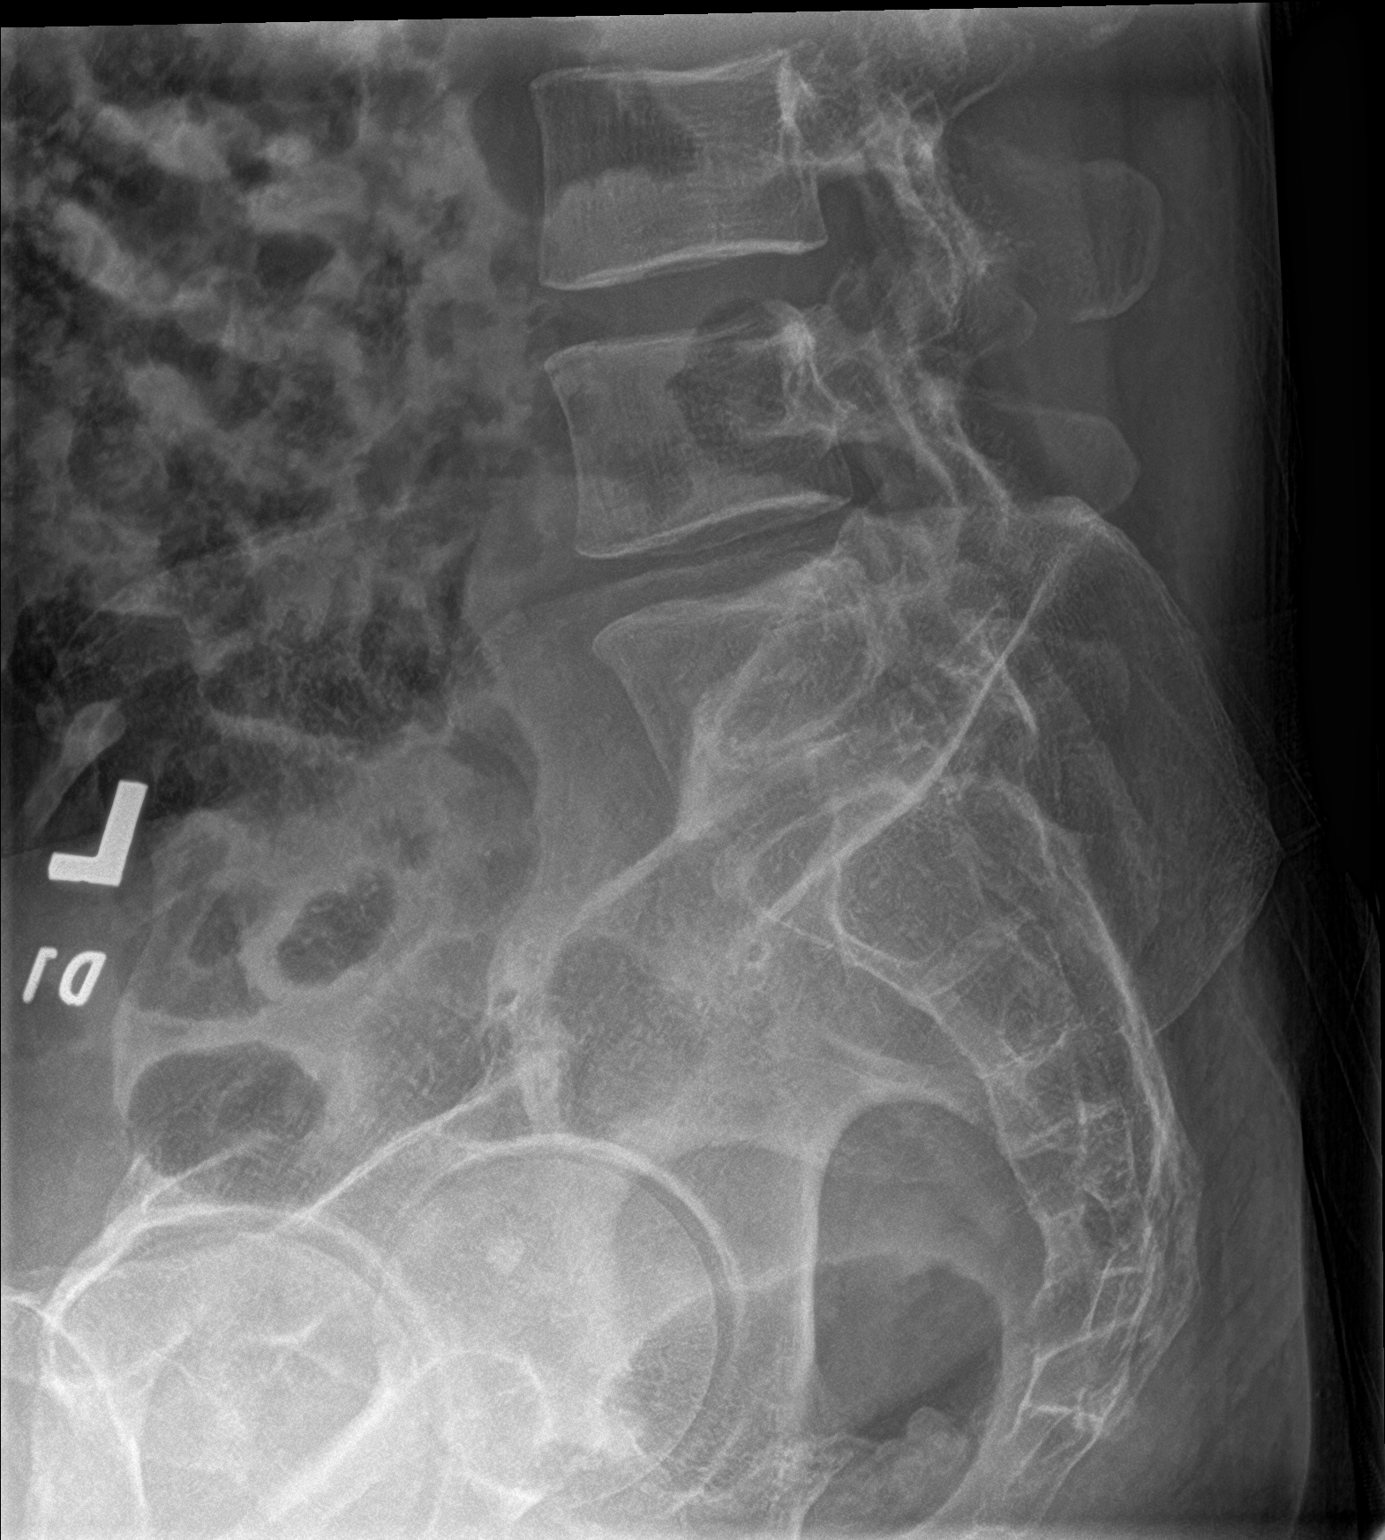

[3 of 3 positions shown; findings below may reference images not displayed]

FINDINGS: Frontal and lateral views of the lumbar spine are obtained. There
are 5 non-rib-bearing lumbar type vertebral bodies in anatomic
alignment. No acute displaced fracture. Disc spaces are well
preserved. Sacroiliac joints are normal.
IMPRESSION: 1. Unremarkable lumbar spine.

## 2021-07-11 ENCOUNTER — Encounter: Payer: Self-pay | Admitting: Physician Assistant

## 2023-01-16 NOTE — Telephone Encounter (Signed)
Error
